# Patient Record
Sex: Male | Born: 1980 | Race: White | Hispanic: No | Smoking: Never smoker
Health system: Southern US, Community
[De-identification: ages and names within clinical notes are randomized; demographics above are authoritative.]

## PROBLEM LIST (undated history)

## (undated) DIAGNOSIS — N200 Calculus of kidney: Secondary | ICD-10-CM

---

## 2015-11-13 DIAGNOSIS — J3089 Other allergic rhinitis: Secondary | ICD-10-CM | POA: Diagnosis not present

## 2015-11-13 DIAGNOSIS — J069 Acute upper respiratory infection, unspecified: Secondary | ICD-10-CM | POA: Diagnosis not present

## 2016-01-27 ENCOUNTER — Encounter: Payer: Self-pay | Admitting: Emergency Medicine

## 2016-01-27 DIAGNOSIS — R300 Dysuria: Secondary | ICD-10-CM | POA: Diagnosis present

## 2016-01-27 DIAGNOSIS — R339 Retention of urine, unspecified: Secondary | ICD-10-CM | POA: Insufficient documentation

## 2016-01-27 DIAGNOSIS — N4889 Other specified disorders of penis: Secondary | ICD-10-CM | POA: Insufficient documentation

## 2016-01-27 DIAGNOSIS — R3915 Urgency of urination: Secondary | ICD-10-CM | POA: Diagnosis not present

## 2016-01-27 NOTE — ED Notes (Addendum)
Pt to triage via w/c with no distress noted; pt c/o penile pain x week accomp by urgency and "it's black and blue and swollen"; upon inspection, small abrasion with small area of purplish bruising noted just around urethral opening; denies back or abd pain

## 2016-01-28 ENCOUNTER — Emergency Department: Payer: 59

## 2016-01-28 ENCOUNTER — Emergency Department
Admission: EM | Admit: 2016-01-28 | Discharge: 2016-01-28 | Disposition: A | Discharge status: Home / Self Care | Payer: 59 | Attending: Emergency Medicine | Admitting: Emergency Medicine

## 2016-01-28 DIAGNOSIS — N4889 Other specified disorders of penis: Secondary | ICD-10-CM | POA: Diagnosis not present

## 2016-01-28 DIAGNOSIS — R339 Retention of urine, unspecified: Secondary | ICD-10-CM | POA: Diagnosis not present

## 2016-01-28 DIAGNOSIS — K4090 Unilateral inguinal hernia, without obstruction or gangrene, not specified as recurrent: Secondary | ICD-10-CM | POA: Diagnosis not present

## 2016-01-28 DIAGNOSIS — R3915 Urgency of urination: Secondary | ICD-10-CM | POA: Diagnosis not present

## 2016-01-28 HISTORY — DX: Calculus of kidney: N20.0

## 2016-01-28 LAB — CBC
HCT: 46.9 % (ref 40.0–52.0)
Hemoglobin: 16 g/dL (ref 13.0–18.0)
MCH: 29.3 pg (ref 26.0–34.0)
MCHC: 34.2 g/dL (ref 32.0–36.0)
MCV: 85.8 fL (ref 80.0–100.0)
PLATELETS: 204 10*3/uL (ref 150–440)
RBC: 5.47 MIL/uL (ref 4.40–5.90)
RDW: 13.4 % (ref 11.5–14.5)
WBC: 13.9 10*3/uL — ABNORMAL HIGH (ref 3.8–10.6)

## 2016-01-28 LAB — BASIC METABOLIC PANEL
Anion gap: 8 (ref 5–15)
BUN: 16 mg/dL (ref 6–20)
CALCIUM: 9 mg/dL (ref 8.9–10.3)
CHLORIDE: 103 mmol/L (ref 101–111)
CO2: 24 mmol/L (ref 22–32)
CREATININE: 0.99 mg/dL (ref 0.61–1.24)
GFR calc Af Amer: >60 mL/min (ref >60)
Glucose, Bld: 117 mg/dL — ABNORMAL HIGH (ref 65–99)
Potassium: 3.1 mmol/L — ABNORMAL LOW (ref 3.5–5.1)
SODIUM: 135 mmol/L (ref 135–145)

## 2016-01-28 LAB — URINALYSIS COMPLETE WITH MICROSCOPIC (ARMC ONLY)
BACTERIA UA: NONE SEEN
BILIRUBIN URINE: NEGATIVE
Glucose, UA: NEGATIVE mg/dL
NITRITE: NEGATIVE
PH: 7 (ref 5.0–8.0)
PROTEIN: NEGATIVE mg/dL
SPECIFIC GRAVITY, URINE: 1.018 (ref 1.005–1.030)

## 2016-01-28 MED ORDER — ACETAMINOPHEN 325 MG PO TABS
650.0000 mg | ORAL_TABLET | Freq: Once | ORAL | Status: AC
  Administered 2016-01-28: 650 mg via ORAL

## 2016-01-28 MED ORDER — LIDOCAINE HCL 2 % EX GEL
1.0000 "application " | Freq: Once | CUTANEOUS | Status: AC
  Administered 2016-01-28: 1 via URETHRAL

## 2016-01-28 MED ORDER — MORPHINE SULFATE (PF) 4 MG/ML IV SOLN
4.0000 mg | Freq: Once | INTRAVENOUS | Status: AC
  Administered 2016-01-28: 4 mg via INTRAVENOUS
  Filled 2016-01-28: qty 1

## 2016-01-28 MED ORDER — ACETAMINOPHEN 325 MG PO TABS
ORAL_TABLET | ORAL | Status: AC
Start: 2016-01-28 — End: 2016-01-28
  Administered 2016-01-28: 650 mg via ORAL
  Filled 2016-01-28: qty 2

## 2016-01-28 MED ORDER — LIDOCAINE HCL 2 % EX GEL
CUTANEOUS | Status: AC
  Administered 2016-01-28: 1 via URETHRAL
  Filled 2016-01-28: qty 10

## 2016-01-28 MED ORDER — ONDANSETRON HCL 4 MG/2ML IJ SOLN
4.0000 mg | Freq: Once | INTRAMUSCULAR | Status: AC
  Administered 2016-01-28: 4 mg via INTRAVENOUS
  Filled 2016-01-28: qty 2

## 2016-01-28 NOTE — Discharge Instructions (Signed)
Acute Urinary Retention, Male °Acute urinary retention is the temporary inability to urinate. °This is a common problem in older men. As men age their prostates become larger and block the flow of urine from the bladder. This is usually a problem that has come on gradually.  °HOME CARE INSTRUCTIONS °If you are sent home with a Foley catheter and a drainage system, you will need to discuss the best course of action with your health care provider. While the catheter is in, maintain a good intake of fluids. Keep the drainage bag emptied and lower than your catheter. This is so that contaminated urine will not flow back into your bladder, which could lead to a urinary tract infection. °There are two main types of drainage bags. One is a large bag that usually is used at night. It has a good capacity that will allow you to sleep through the night without having to empty it. The second type is called a leg bag. It has a smaller capacity, so it needs to be emptied more frequently. However, the main advantage is that it can be attached by a leg strap and can go underneath your clothing, allowing you the freedom to move about or leave your home. °Only take over-the-counter or prescription medicines for pain, discomfort, or fever as directed by your health care provider.  °SEEK MEDICAL CARE IF: °· You develop a low-grade fever. °· You experience spasms or leakage of urine with the spasms. °SEEK IMMEDIATE MEDICAL CARE IF:  °· You develop chills or fever. °· Your catheter stops draining urine. °· Your catheter falls out. °· You start to develop increased bleeding that does not respond to rest and increased fluid intake. °MAKE SURE YOU: °· Understand these instructions. °· Will watch your condition. °· Will get help right away if you are not doing well or get worse. °  °This information is not intended to replace advice given to you by your health care provider. Make sure you discuss any questions you have with your health care  provider. °  °Document Released: 01/23/2001 Document Revised: 03/03/2015 Document Reviewed: 03/28/2013 °Elsevier Interactive Patient Education ©2016 [employer] ° °

## 2016-01-28 NOTE — ED Notes (Addendum)
Pt pulled call bell in lobby bathroom; sitting on commode, holding penis, yelling "be strong Matt, be strong Matt, you're stronger than this!"; st unable to pee; pt brought to triage; bladder scanner indicates urine, pt moaning & breathing heavily stating "calm down, calm down"; ; pt taken to room 25 for further evaluation and acuity level changed

## 2016-01-28 NOTE — ED Provider Notes (Signed)
[facility] Emergency Department Provider Note  ____________________________________________  Time seen: 2:00 AM  I have reviewed the triage vital signs and the nursing notes.   HISTORY  Chief Complaint Dysuria     HPI Ronald Dawson is a 35 y.o. male presents to the emergency department via EMS with penile pain times one week accompanied by urinary urgency. Patient states the last time he is able to urinate was at 4 PM yesterday stating that he is unable to urinate although he feels the urge to do so. His current pain as 10 out of 10. Patient states that his penis is black and blue" however denies any trauma.     Past Medical History  Diagnosis Date  . Kidney stone     There are no active problems to display for this patient.   Past surgical history None No current outpatient prescriptions on file.  Allergies No known drug allergies No family history on file.  Social History Social History  Substance Use Topics  . Smoking status: Never Smoker   . Smokeless tobacco: None  . Alcohol Use: No    Review of Systems  Constitutional: Negative for fever. Eyes: Negative for visual changes. ENT: Negative for sore throat. Cardiovascular: Negative for chest pain. Respiratory: Negative for shortness of breath. Gastrointestinal: Negative for abdominal pain, vomiting and diarrhea. Genitourinary: Positive for penile pain and urinary urgency Musculoskeletal: Negative for back pain. Skin: Negative for rash. Neurological: Negative for headaches, focal weakness or numbness.   10-point ROS otherwise negative.  ____________________________________________   PHYSICAL EXAM:  VITAL SIGNS: ED Triage Vitals  Enc Vitals Group     BP 01/27/16 2322 140/90 mmHg     Pulse Rate 01/27/16 2322 78     Resp 01/27/16 2322 22     Temp 01/27/16 2322 97.5 F (36.4 C)     Temp Source 01/27/16 2322 Oral     SpO2 01/27/16 2322 97 %     Weight 01/27/16 2322 280  lb (127.007 kg)     Height 01/27/16 2322  (1.753 m)     Head Cir --      Peak Flow --      Pain Score 01/27/16 2323 10     Pain Loc --      Pain Edu? --      Excl. in GC? --      Constitutional: Alert and oriented. Well appearing and in no distress. Eyes: Conjunctivae are normal. PERRL. Normal extraocular movements. ENT   Head: Normocephalic and atraumatic.   Nose: No congestion/rhinnorhea.   Mouth/Throat: Mucous membranes are moist.   Neck: No stridor. Hematological/Lymphatic/Immunilogical: No cervical lymphadenopathy. Cardiovascular: Normal rate, regular rhythm. Normal and symmetric distal pulses are present in all extremities. No murmurs, rubs, or gallops. Respiratory: Normal respiratory effort without tachypnea nor retractions. Breath sounds are clear and equal bilaterally. No wheezes/rales/rhonchi. Gastrointestinal: Soft and nontender. No distention. There is no CVA tenderness. Genitourinary: deferred Musculoskeletal: Nontender with normal range of motion in all extremities. No joint effusions.  No lower extremity tenderness nor edema. Neurologic:  Normal speech and language. No gross focal neurologic deficits are appreciated. Speech is normal.  Skin:  Skin is warm, dry and intact. No rash noted. Psychiatric: Mood and affect are normal. Speech and behavior are normal. Patient exhibits appropriate insight and judgment.  ____________________________________________    LABS (pertinent positives/negatives)  Labs Reviewed  URINALYSIS COMPLETEWITH MICROSCOPIC ([facility] ONLY) - Abnormal; Notable for the following:    Color, Urine  YELLOW (*)    APPearance CLOUDY (*)    Ketones, ur TRACE (*)    Hgb urine dipstick 2+ (*)    Leukocytes, UA TRACE (*)    Squamous Epithelial / LPF 0-5 (*)    All other components within normal limits  CBC - Abnormal; Notable for the following:    WBC 13.9 (*)    All other components within normal limits  BASIC METABOLIC PANEL -  Abnormal; Notable for the following:    Potassium 3.1 (*)    Glucose, Bld 117 (*)    All other components within normal limits      RADIOLOGY  CT Renal Stone Study (Final result) Result time: 01/28/16 03:11:04   Final result by Rad Results In Interface (01/28/16 03:11:04)   Narrative:   CLINICAL DATA: Acute onset of penile pain and abrasion. Initial encounter.  EXAM: CT ABDOMEN AND PELVIS WITHOUT CONTRAST  TECHNIQUE: Multidetector CT imaging of the abdomen and pelvis was performed following the standard protocol without IV contrast.  COMPARISON: None.  FINDINGS: The visualized lung bases are clear.  The liver and spleen are unremarkable in appearance. The gallbladder is within normal limits. The pancreas and adrenal glands are unremarkable.  The kidneys are unremarkable in appearance. There is no evidence of hydronephrosis. No renal or ureteral stones are seen. Nonspecific perinephric stranding is noted bilaterally.  No free fluid is identified. The small bowel is unremarkable in appearance. The stomach is within normal limits. No acute vascular abnormalities are seen.  The appendix is normal in caliber, without evidence of appendicitis. The colon is grossly unremarkable in appearance.  The bladder is moderately distended and grossly unremarkable. The prostate is normal in size, with minimal calcification. A small right inguinal hernia is noted, containing only fat. No inguinal lymphadenopathy is seen.  The penile tip is grossly unremarkable in appearance, though difficult to fully assess on CT.  No acute osseous abnormalities are identified.  IMPRESSION: 1. No acute abnormality seen within the abdomen or pelvis. 2. Small right inguinal hernia, containing only fat.   Electronically Signed By: Roanna RaiderJeffery Chang M.D. On: 01/28/2016 03:11      INITIAL IMPRESSION / ASSESSMENT AND PLAN / ED COURSE  Pertinent labs & imaging results that were  available during my care of the patient were reviewed by me and considered in my medical decision making (see chart for details).  On inspection of the patient's penis there appeared to be a occlusion of the patient's external urethral meatus by what appeared to be either Dermabond or liquid bandage. I asked the patient if he applied any of those substances to his penis to which she responded no. Attempts to wipe the substance of the patient's external urethral meatus and a small area was able to be removed with resultant expression of urine. I introduced the Urojet lidocaine into that small area and patient then subsequently voided approximately 1 L of urine  ____________________________________________   FINAL CLINICAL IMPRESSION(S) / ED DIAGNOSES  Final diagnoses:  Urinary retention      Darci Currentandolph N Brown, MD 01/28/16 [phone]

## 2016-02-24 ENCOUNTER — Ambulatory Visit: Payer: Self-pay | Admitting: Urology

## 2016-03-23 DIAGNOSIS — J3089 Other allergic rhinitis: Secondary | ICD-10-CM | POA: Diagnosis not present

## 2016-03-23 DIAGNOSIS — J069 Acute upper respiratory infection, unspecified: Secondary | ICD-10-CM | POA: Diagnosis not present

## 2019-07-24 ENCOUNTER — Emergency Department
Admission: EM | Admit: 2019-07-24 | Discharge: 2019-07-24 | Disposition: A | Discharge status: Home / Self Care | Payer: BC Managed Care – PPO | Attending: Emergency Medicine | Admitting: Emergency Medicine

## 2019-07-24 ENCOUNTER — Other Ambulatory Visit: Payer: Self-pay

## 2019-07-24 ENCOUNTER — Encounter: Payer: Self-pay | Admitting: Emergency Medicine

## 2019-07-24 ENCOUNTER — Emergency Department: Payer: BC Managed Care – PPO

## 2019-07-24 DIAGNOSIS — R1084 Generalized abdominal pain: Secondary | ICD-10-CM | POA: Insufficient documentation

## 2019-07-24 DIAGNOSIS — R110 Nausea: Secondary | ICD-10-CM | POA: Diagnosis not present

## 2019-07-24 DIAGNOSIS — R109 Unspecified abdominal pain: Secondary | ICD-10-CM | POA: Diagnosis present

## 2019-07-24 DIAGNOSIS — K5900 Constipation, unspecified: Secondary | ICD-10-CM | POA: Diagnosis not present

## 2019-07-24 DIAGNOSIS — R1030 Lower abdominal pain, unspecified: Secondary | ICD-10-CM | POA: Diagnosis not present

## 2019-07-24 LAB — COMPREHENSIVE METABOLIC PANEL
ALT: 41 U/L (ref 0–44)
AST: 38 U/L (ref 15–41)
Albumin: 4.5 g/dL (ref 3.5–5.0)
Alkaline Phosphatase: 52 U/L (ref 38–126)
Anion gap: 14 (ref 5–15)
BUN: 14 mg/dL (ref 6–20)
CO2: 20 mmol/L — ABNORMAL LOW (ref 22–32)
Calcium: 9.2 mg/dL (ref 8.9–10.3)
Chloride: 104 mmol/L (ref 98–111)
Creatinine, Ser: 1.33 mg/dL — ABNORMAL HIGH (ref 0.61–1.24)
GFR calc Af Amer: >60 mL/min (ref >60)
GFR calc non Af Amer: >60 mL/min (ref >60)
Glucose, Bld: 115 mg/dL — ABNORMAL HIGH (ref 70–99)
Potassium: 3.5 mmol/L (ref 3.5–5.1)
Sodium: 138 mmol/L (ref 135–145)
Total Bilirubin: 0.8 mg/dL (ref 0.3–1.2)
Total Protein: 8 g/dL (ref 6.5–8.1)

## 2019-07-24 LAB — CBC
HCT: 48.6 % (ref 39.0–52.0)
Hemoglobin: 16.3 g/dL (ref 13.0–17.0)
MCH: 28.4 pg (ref 26.0–34.0)
MCHC: 33.5 g/dL (ref 30.0–36.0)
MCV: 84.8 fL (ref 80.0–100.0)
Platelets: 259 10*3/uL (ref 150–400)
RBC: 5.73 MIL/uL (ref 4.22–5.81)
RDW: 13.2 % (ref 11.5–15.5)
WBC: 13.4 10*3/uL — ABNORMAL HIGH (ref 4.0–10.5)
nRBC: 0 % (ref 0.0–0.2)

## 2019-07-24 LAB — LIPASE, BLOOD: Lipase: 30 U/L (ref 11–51)

## 2019-07-24 MED ORDER — ONDANSETRON HCL 4 MG/2ML IJ SOLN
4.0000 mg | Freq: Once | INTRAMUSCULAR | Status: AC
  Administered 2019-07-24: 16:00:00 4 mg via INTRAVENOUS
  Filled 2019-07-24: qty 2

## 2019-07-24 MED ORDER — MORPHINE SULFATE (PF) 4 MG/ML IV SOLN
4.0000 mg | Freq: Once | INTRAVENOUS | Status: AC
  Administered 2019-07-24: 4 mg via INTRAVENOUS
  Filled 2019-07-24: qty 1

## 2019-07-24 MED ORDER — NAPROXEN 500 MG PO TABS
500.0000 mg | ORAL_TABLET | Freq: Once | ORAL | Status: AC
  Administered 2019-07-24: 500 mg via ORAL
  Filled 2019-07-24: qty 1

## 2019-07-24 MED ORDER — DICYCLOMINE HCL 10 MG/ML IM SOLN
20.0000 mg | Freq: Once | INTRAMUSCULAR | Status: AC
  Administered 2019-07-24: 20 mg via INTRAMUSCULAR
  Filled 2019-07-24 (×2): qty 2

## 2019-07-24 MED ORDER — DICYCLOMINE HCL 20 MG PO TABS: 20.0000 mg | ORAL_TABLET | Freq: Three times a day (TID) | ORAL | 0 refills | Status: AC | PRN

## 2019-07-24 NOTE — ED Notes (Signed)
Pt in bathroom, will room when out.

## 2019-07-24 NOTE — ED Provider Notes (Signed)
[facility] Emergency Department Provider Note   ____________________________________________    I have reviewed the triage vital signs and the nursing notes.   HISTORY  Chief Complaint Abdominal Pain and Constipation     HPI Ronald Dawson is a 38 y.o. male who presents with complaints of abdominal pain, cramping.  Patient feels that he may be constipated because he is having difficulty stooling.  Does note that he has been to [facility] emergency department twice in the past week for somewhat similar complaints, was diagnosed with cystitis and put on antibiotics.  Has urology follow-up tomorrow.  Patient complains that he has been feeling constipated and having cramping in his lower abdomen.  Did take some laxatives with little improvement.  Denies fevers or chills.  No nausea or vomiting.  Past Medical History:  Diagnosis Date  . Kidney stone     There are no active problems to display for this patient.   History reviewed. No pertinent surgical history.  Prior to Admission medications   Medication Sig Start Date End Date Taking? Authorizing Provider  dicyclomine (BENTYL) 20 MG tablet Take 1 tablet (20 mg total) by mouth 3 (three) times daily as needed for spasms. 07/24/19 07/23/20  Jene Every, MD     Allergies Patient has no known allergies.  No family history on file.  Social History Social History   Tobacco Use  . Smoking status: Never Smoker  . Smokeless tobacco: Never Used  Substance Use Topics  . Alcohol use: No  . Drug use: Never    Review of Systems  Constitutional: No fever/chills Eyes: No visual changes.  ENT: No sore throat. Cardiovascular: Denies chest pain. Respiratory: Denies shortness of breath. Gastrointestinal: As above Genitourinary: Negative for dysuria. Musculoskeletal: Negative for back pain. Skin: Negative for rash. Neurological: Negative for headaches or weakness    ____________________________________________   PHYSICAL EXAM:  VITAL SIGNS: ED Triage Vitals  Enc Vitals Group     BP 07/24/19 1252 (!) 157/58     Pulse Rate 07/24/19 1252 (!) 107     Resp 07/24/19 1252 19     Temp 07/24/19 1252 97.7 F (36.5 C)     Temp Source 07/24/19 1252 Oral     SpO2 07/24/19 1252 96 %     Weight 07/24/19 1252 136.1 kg (300 lb)     Height 07/24/19 1252 1.753 m (5\' 9" )     Head Circumference --      Peak Flow --      Pain Score 07/24/19 1258 10     Pain Loc --      Pain Edu? --      Excl. in GC? --     Constitutional: Alert and oriented.  Nose: No congestion/rhinnorhea. Mouth/Throat: Mucous membranes are moist.    Cardiovascular: Normal rate, regular rhythm. Grossly normal heart sounds.  Good peripheral circulation. Respiratory: Normal respiratory effort.  No retractions. Lungs CTAB. Gastrointestinal: Soft and nontender. No distention.  No CVA tenderness.  Musculoskeletal:.  Warm and well perfused Neurologic:  Normal speech and language. No gross focal neurologic deficits are appreciated.  Skin:  Skin is warm, dry and intact. No rash noted. Psychiatric: Mood and affect are normal. Speech and behavior are normal.  ____________________________________________   LABS (all labs ordered are listed, but only abnormal results are displayed)  Labs Reviewed  COMPREHENSIVE METABOLIC PANEL - Abnormal; Notable for the following components:      Result Value   CO2 20 (*)  Glucose, Bld 115 (*)    Creatinine, Ser 1.33 (*)    All other components within normal limits  CBC - Abnormal; Notable for the following components:   WBC 13.4 (*)    All other components within normal limits  LIPASE, BLOOD  URINALYSIS, COMPLETE (UACMP) WITH MICROSCOPIC   ____________________________________________  EKG  None ____________________________________________  RADIOLOGY  KUB unremarkable ____________________________________________   PROCEDURES   Procedure(s) performed: No  Procedures   Critical Care performed: No ____________________________________________   INITIAL IMPRESSION / ASSESSMENT AND PLAN / ED COURSE  Pertinent labs & imaging results that were available during my care of the patient were reviewed by me and considered in my medical decision making (see chart for details).  Patient overall well-appearing in no acute distress, abdominal exam is quite reassuring.  Lab work significant for mildly elevated white blood cell count which is quite nonspecific.  Patient is being treated with antibiotics for cystitis, KUB does not demonstrate any significant abnormality.  Patient treated with IV analgesics here in the emergency department with significant relief, also treated with IM Bentyl for cramping which she reports helped significantly.    On reevaluation patient is feeling much better. Given that he has urology follow-up tomorrow and is feeling improved appropriate for discharge at this time, return precautions discussed    ____________________________________________   FINAL CLINICAL IMPRESSION(S) / ED DIAGNOSES  Final diagnoses:  Generalized abdominal pain        Note:  This document was prepared using Dragon voice recognition software and may include unintentional dictation errors.   Lavonia Drafts, MD 07/24/19 2040

## 2019-07-24 NOTE — ED Notes (Signed)
First Nurse Note; pt in via [facility], reports severe abdominal pain; seen at [facility] twice in last few days, dx with UTI and Prostate Infection.  Has started antibiotics and using Percocet for pain without any relief.  Pt hyperventilating in triage.

## 2019-07-24 NOTE — ED Triage Notes (Signed)
See first nurse note. 

## 2019-07-24 NOTE — ED Notes (Signed)
Pt states he is unable to provide urine specimen at this time, states, "No, its too painful, I don't even think I can stand."

## 2020-05-25 IMAGING — CR DG ABDOMEN 1V
5 series · 5 of 5 positions shown · non-contrast
Comparison: CT renal stone protocol 01/28/2016

CLINICAL DATA: Constipation.  Kidney stones.

EXAM:
ABDOMEN - 1 VIEW

[abdomen kub (1 of 5)]
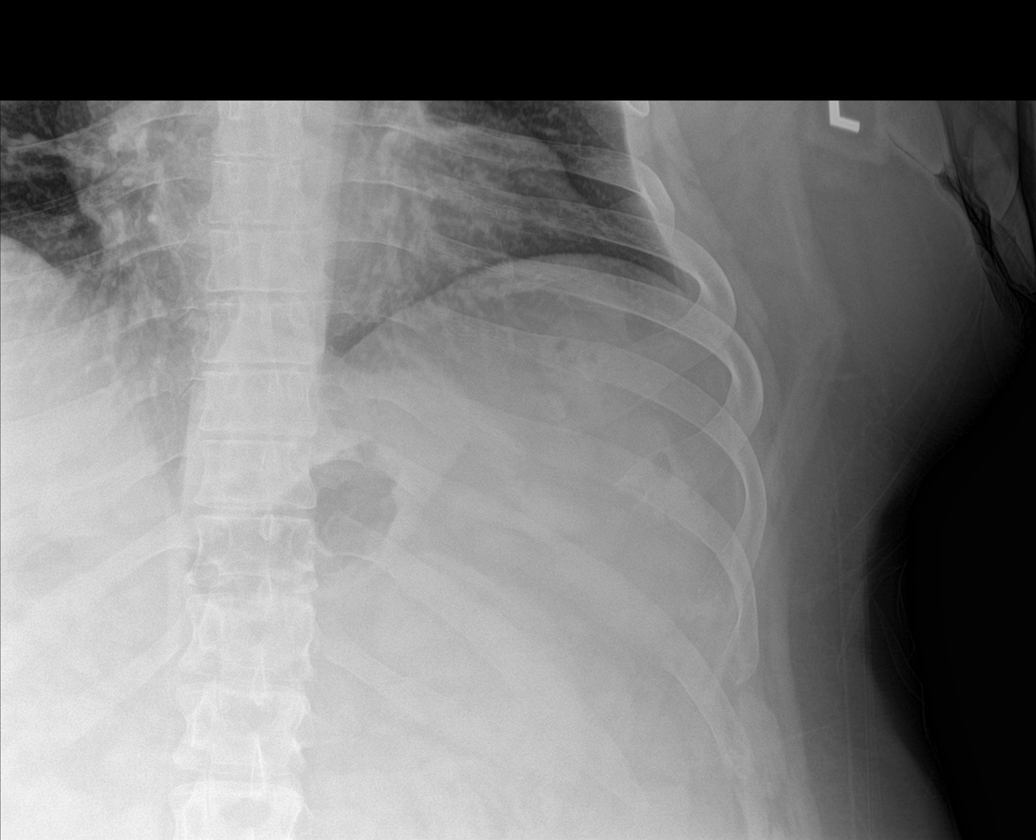

[abdomen kub (2 of 5)]
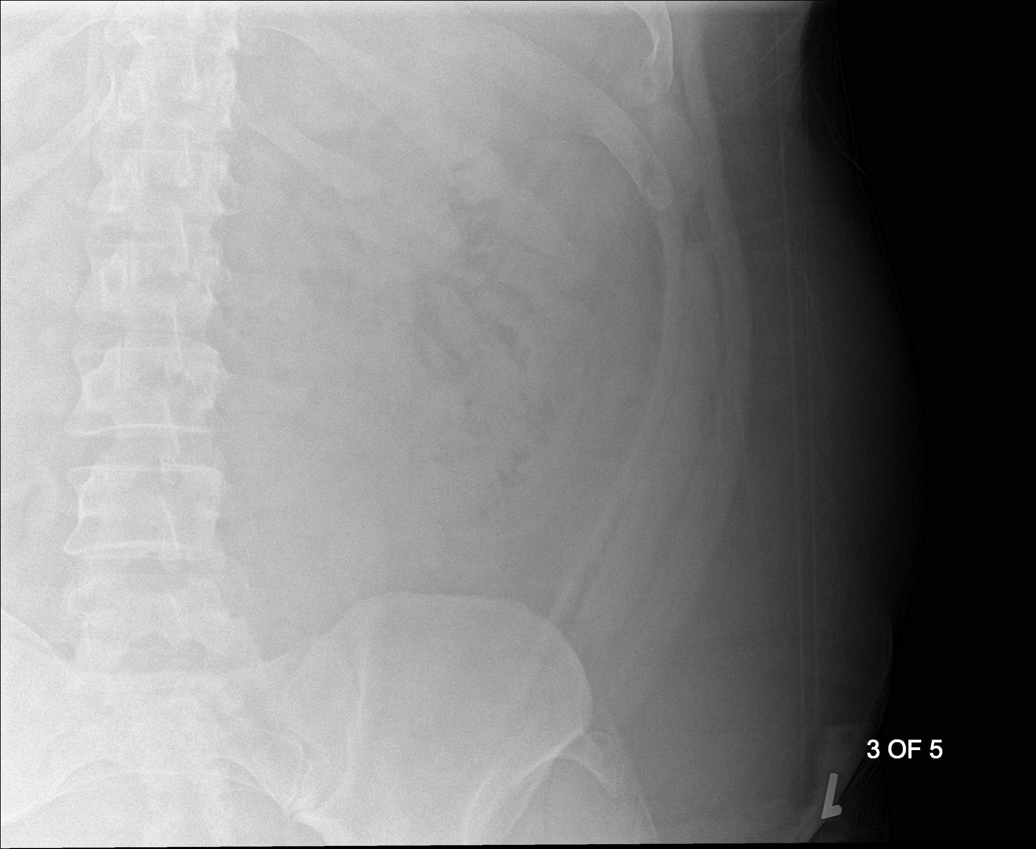

[abdomen kub (3 of 5)]
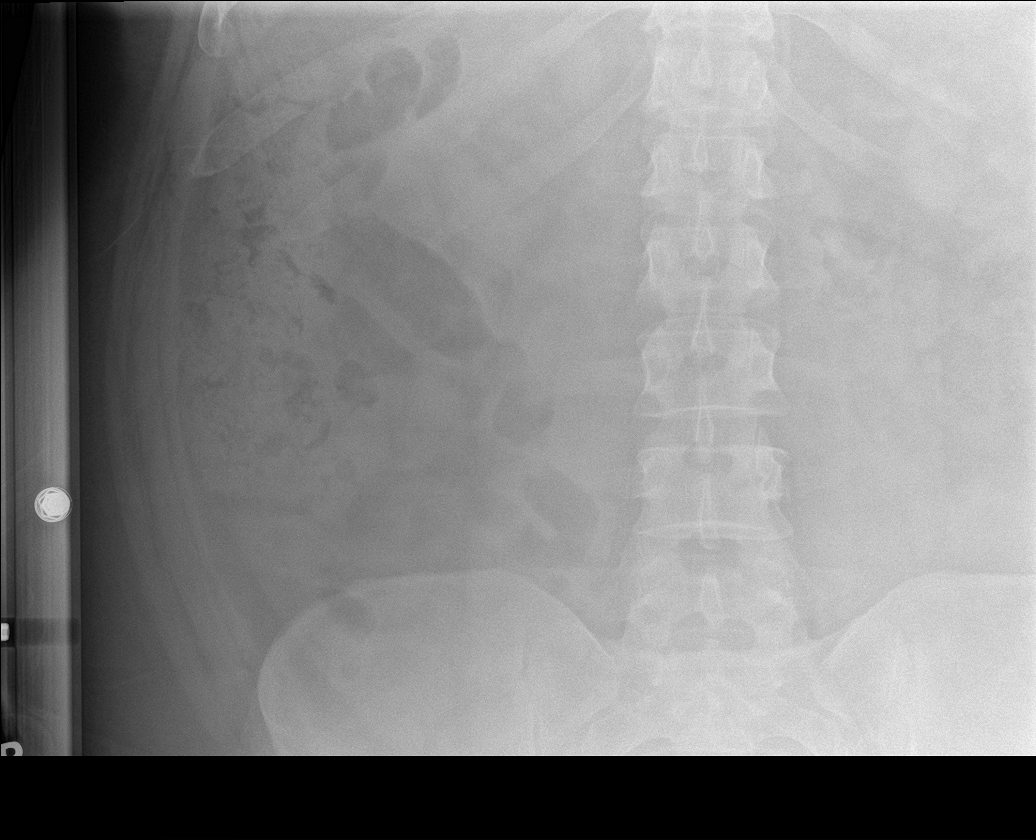

[abdomen kub (4 of 5)]
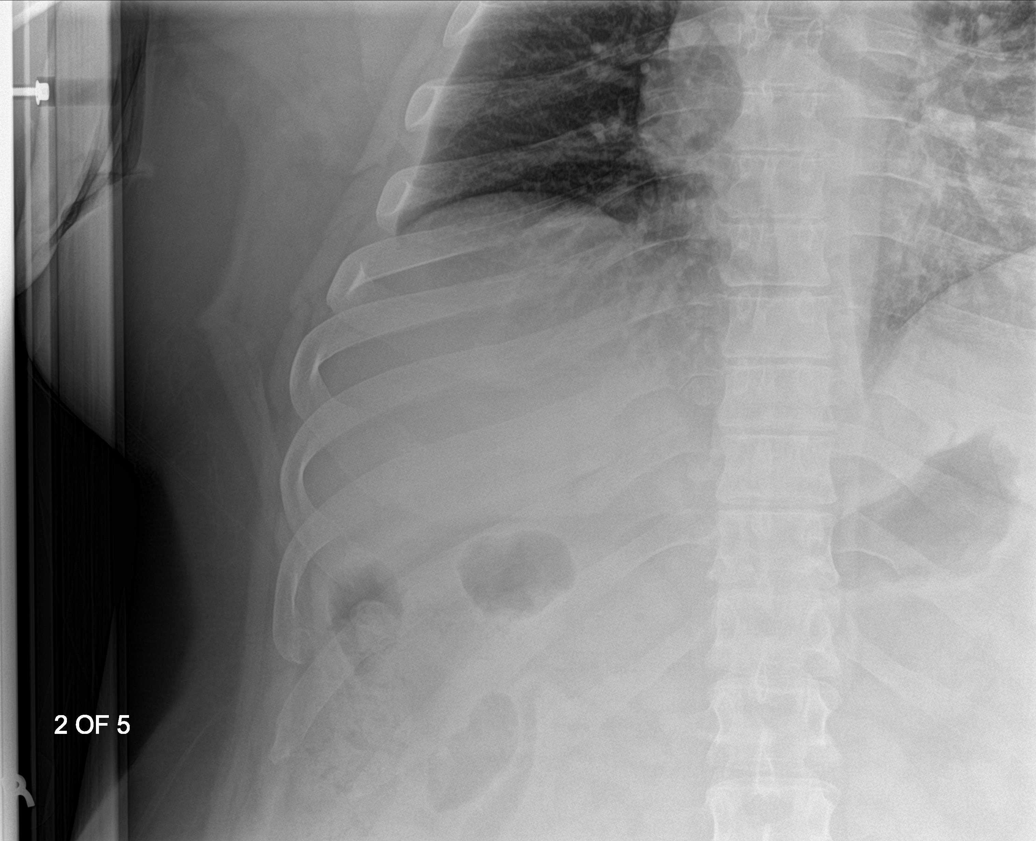

[abdomen kub (5 of 5)]
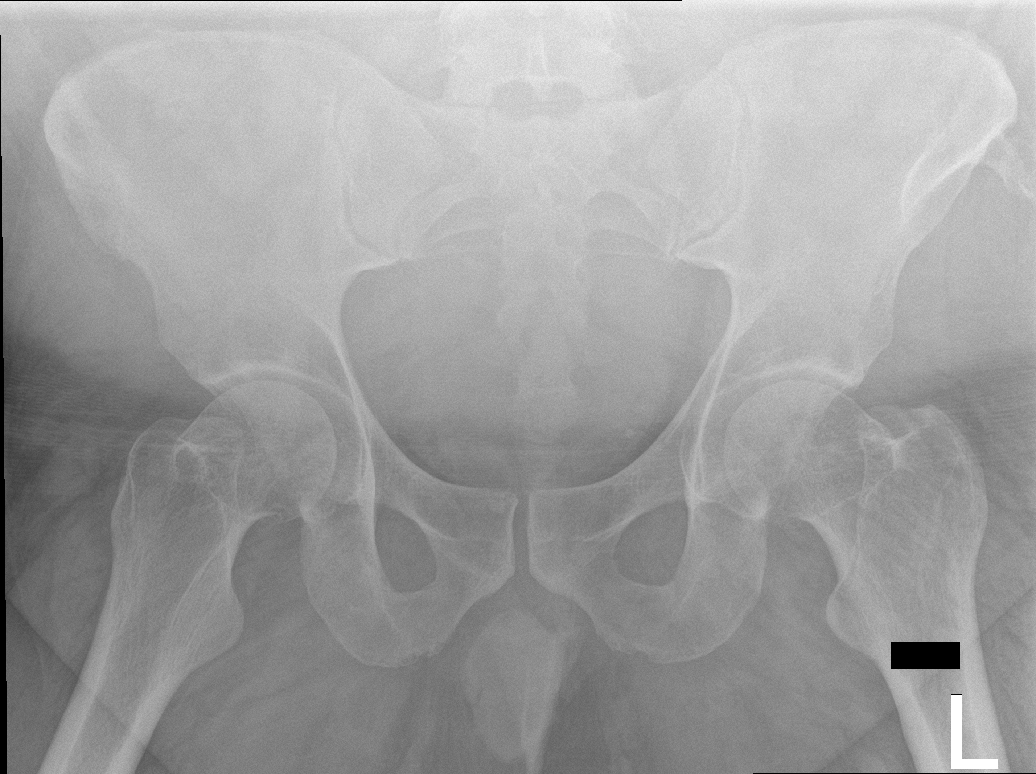

[5 of 5 positions shown; findings below may reference images not displayed]

FINDINGS: The bowel gas pattern is normal. No radio-opaque calculi or other
significant radiographic abnormality are seen.
IMPRESSION: Negative.
